# Patient Record
Sex: Female | Born: 1960 | Race: White | Hispanic: No | Marital: Married | Smoking: Never smoker
Health system: Southern US, Community
[De-identification: ages and names within clinical notes are randomized; demographics above are authoritative.]

---

## 1991-01-09 DIAGNOSIS — D229 Melanocytic nevi, unspecified: Secondary | ICD-10-CM

## 1991-01-09 HISTORY — DX: Melanocytic nevi, unspecified: D22.9

## 1997-08-08 DIAGNOSIS — D229 Melanocytic nevi, unspecified: Secondary | ICD-10-CM

## 1997-08-08 HISTORY — DX: Melanocytic nevi, unspecified: D22.9

## 1998-08-11 ENCOUNTER — Other Ambulatory Visit: Admission: RE | Admit: 1998-08-11 | Discharge: 1998-08-11 | Payer: Self-pay | Admitting: Obstetrics & Gynecology

## 1998-12-12 ENCOUNTER — Other Ambulatory Visit: Admission: RE | Admit: 1998-12-12 | Discharge: 1998-12-12 | Payer: Self-pay | Admitting: Obstetrics & Gynecology

## 1999-10-30 ENCOUNTER — Other Ambulatory Visit: Admission: RE | Admit: 1999-10-30 | Discharge: 1999-10-30 | Payer: Self-pay | Admitting: Obstetrics & Gynecology

## 2000-01-06 ENCOUNTER — Encounter (INDEPENDENT_AMBULATORY_CARE_PROVIDER_SITE_OTHER): Payer: Self-pay | Admitting: Specialist

## 2000-01-06 ENCOUNTER — Other Ambulatory Visit: Admission: RE | Admit: 2000-01-06 | Discharge: 2000-01-06 | Payer: Self-pay | Admitting: Plastic Surgery

## 2000-08-15 ENCOUNTER — Encounter: Payer: Self-pay | Admitting: Emergency Medicine

## 2000-08-15 ENCOUNTER — Emergency Department (HOSPITAL_COMMUNITY): Admission: EM | Admit: 2000-08-15 | Discharge: 2000-08-15 | Payer: Self-pay | Admitting: Emergency Medicine

## 2000-11-29 HISTORY — PX: REDUCTION MAMMAPLASTY: SUR839

## 2001-02-01 ENCOUNTER — Other Ambulatory Visit: Admission: RE | Admit: 2001-02-01 | Discharge: 2001-02-01 | Payer: Self-pay | Admitting: Obstetrics & Gynecology

## 2002-03-30 ENCOUNTER — Other Ambulatory Visit: Admission: RE | Admit: 2002-03-30 | Discharge: 2002-03-30 | Payer: Self-pay | Admitting: Obstetrics & Gynecology

## 2002-05-14 ENCOUNTER — Observation Stay (HOSPITAL_COMMUNITY): Admission: RE | Admit: 2002-05-14 | Discharge: 2002-05-15 | Payer: Self-pay | Admitting: Obstetrics & Gynecology

## 2002-05-14 ENCOUNTER — Encounter (INDEPENDENT_AMBULATORY_CARE_PROVIDER_SITE_OTHER): Payer: Self-pay | Admitting: Specialist

## 2003-09-02 ENCOUNTER — Encounter: Payer: Self-pay | Admitting: Obstetrics & Gynecology

## 2003-09-02 ENCOUNTER — Encounter: Admission: RE | Admit: 2003-09-02 | Discharge: 2003-09-02 | Payer: Self-pay | Admitting: Obstetrics & Gynecology

## 2004-09-02 ENCOUNTER — Encounter: Admission: RE | Admit: 2004-09-02 | Discharge: 2004-09-02 | Payer: Self-pay | Admitting: Obstetrics & Gynecology

## 2005-02-05 ENCOUNTER — Ambulatory Visit: Payer: Self-pay | Admitting: Internal Medicine

## 2005-05-23 ENCOUNTER — Emergency Department (HOSPITAL_COMMUNITY): Admission: EM | Admit: 2005-05-23 | Discharge: 2005-05-23 | Payer: Self-pay | Admitting: Family Medicine

## 2005-09-09 ENCOUNTER — Encounter: Admission: RE | Admit: 2005-09-09 | Discharge: 2005-09-09 | Payer: Self-pay | Admitting: Obstetrics & Gynecology

## 2005-12-29 ENCOUNTER — Ambulatory Visit: Payer: Self-pay | Admitting: Family Medicine

## 2006-09-12 ENCOUNTER — Encounter: Admission: RE | Admit: 2006-09-12 | Discharge: 2006-09-12 | Payer: Self-pay | Admitting: Obstetrics & Gynecology

## 2007-09-14 ENCOUNTER — Encounter: Admission: RE | Admit: 2007-09-14 | Discharge: 2007-09-14 | Payer: Self-pay | Admitting: Obstetrics & Gynecology

## 2008-03-04 DIAGNOSIS — C4491 Basal cell carcinoma of skin, unspecified: Secondary | ICD-10-CM

## 2008-03-04 HISTORY — DX: Basal cell carcinoma of skin, unspecified: C44.91

## 2008-09-16 ENCOUNTER — Encounter: Admission: RE | Admit: 2008-09-16 | Discharge: 2008-09-16 | Payer: Self-pay | Admitting: Obstetrics & Gynecology

## 2009-07-15 DIAGNOSIS — C4491 Basal cell carcinoma of skin, unspecified: Secondary | ICD-10-CM

## 2009-07-15 HISTORY — DX: Basal cell carcinoma of skin, unspecified: C44.91

## 2009-09-17 ENCOUNTER — Encounter: Admission: RE | Admit: 2009-09-17 | Discharge: 2009-09-17 | Payer: Self-pay | Admitting: Obstetrics & Gynecology

## 2010-01-21 ENCOUNTER — Emergency Department (HOSPITAL_COMMUNITY): Admission: EM | Admit: 2010-01-21 | Discharge: 2010-01-21 | Payer: Self-pay | Admitting: Family Medicine

## 2010-09-18 ENCOUNTER — Encounter: Admission: RE | Admit: 2010-09-18 | Discharge: 2010-09-18 | Payer: Self-pay | Admitting: Obstetrics & Gynecology

## 2010-09-24 ENCOUNTER — Encounter: Admission: RE | Admit: 2010-09-24 | Discharge: 2010-09-24 | Payer: Self-pay | Admitting: Obstetrics & Gynecology

## 2011-04-16 NOTE — Op Note (Signed)
[facility]  Patient:    Cindy Sanchez, Cindy Sanchez Visit Number: [id number] MRN: [id number]          Service Type: GYN Location: [id number] Attending Physician:  Lars Pinks Dictated by:   Caralyn Guile Arlyce Dice, M.D. Proc. Date: 05/14/02 Admit Date:  05/14/2002 Discharge Date: 05/15/2002                             Operative Report  PREOPERATIVE DIAGNOSES:       1. Leiomyomata uteri.                               2. Menorrhagia.  POSTOPERATIVE DIAGNOSES:      1. Leiomyomata uteri.                               2. Menorrhagia.  OPERATION:                    Transvaginal hysterectomy.  SURGEON:                      Richard D. Arlyce Dice, M.D.  ASSISTANT:                    Janeece Riggers. Dareen Piano, M.D.  ANESTHESIA:                   General endotracheal.  ESTIMATED BLOOD LOSS:         100 cc.  FINDINGS:                     Leiomyomatous uterus.  Normal appearing tubes and ovaries.  INDICATIONS:                  This is a 50 year old gravida 2, para 2, who has noted increasingly heavy periods with cramping over the last six months.  A sonohysterogram was performed and showed a 2 cm submucous myoma.  The option of hysteroscopic myomectomy versus transvaginal hysterectomy was discussed with the patient who opted to proceed with transvaginal hysterectomy.  DESCRIPTION OF PROCEDURE:     The patient was taken to the operating room, and general endotracheal anesthesia was induced.  She was then placed in the dorsal lithotomy position.  The vagina, perineum and lower abdomen were prepped and draped in a sterile fashion.  The paracervical tissues were infiltrated with 0.5% Marcaine with 1:200,000 epinephrine solution.  The incision was made around the circumference of the cervix, and the vaginal mucosa was dissected free.  The posterior cul-de-sac was entered, and the uterosacral ligaments were clamped, cut and ligated bilaterally.  Following this, the cardinal  ligaments and the uterine arteries were clamped with a LigaSure clamp, cauterized and cut.  The anterior cul-de-sac was entered and prior to the cautery of the uterine arteries.  At this point, the uterus was delivered posteriorly.  The uterine ovarian anastomoses were clamped with a ligature clamp, cauterized and cut.  This freed the specimen which was sent to pathology.  Hemostasis was noted to be present.  The cul-de-sac was closed with a chromic suture from uterosacral to uterosacral.  The vaginal cuff posteriorly was closed with a running interlocking 1 chromic gut suture.  The peritoneum was closed with a pursestring suture, and the anterior cuff was closed  with figure-of-eight sutures.  The procedure was then terminated.  The bladder was catheterized at the end of the procedure, and clear urine was obtained.  The patient left the operating room in good condition. Dictated by:   Caralyn Guile Arlyce Dice, M.D. Attending Physician:  Lars Pinks DD:  05/14/02 TD:  05/15/02 Job: [id number]

## 2011-04-16 NOTE — H&P (Signed)
[facility]  PatientKALLE, BERNATH Visit Number: [id number] MRN: [id number]          Service Type: Attending:  Caralyn Guile. Arlyce Dice, M.D. Dictated by:   Caralyn Guile Arlyce Dice, M.D. Adm. Date:  05/14/02                           History and Physical  CHIEF COMPLAINT:              Myoma uteri.  HISTORY OF PRESENT ILLNESS:   This is a 50 year old, gravida 2, para, who is admitted for a vaginal hysterectomy.  The patient has a six-month history of increasingly heavy periods with cramping.  The patient was noted on her last exam to have what appeared to be a large endocervical polyp which could not be removed with ring forceps.  She had a sonohistogram done which revealed a 2 cm submucous myoma.  In addition, several other small myomas were noted in the intramural area.  These findings were discussed with the patient, and various procedures were offered including a hysteroscopy with removal of the submucous myoma and a D&C.  The patient listened to all the options and decided that a vaginal hysterectomy was her choice because of the security of permanent cure from this operation.  The risks were discussed with the patient at that time.  PAST MEDICAL HISTORY:         Negative for any previous abdominal surgery. She had knee surgery in 2001 status post a fall.  She has no medical problems.  ALLERGIES:                    No known allergies.  FAMILY HISTORY:               Reveals no breast, ovarian, or colon cancer.  No significant cardiovascular risk.  SOCIAL HISTORY:               She has been married for 22 years.  She does not take recreational drugs.  She does not smoke cigarettes.  REVIEW OF SYSTEMS:            Negative in detail.  PHYSICAL EXAMINATION:  VITAL SIGNS:                  She is 5 feet 4 inches tall, 127 pounds. Afebrile.  Blood pressure 120/80.  HEENT:                        Ears, nose, and throat are normal.  NECK:                          Supple without thyromegaly or lymphadenopathy.  BREASTS:                      Without masses.  HEART:                        Regular sinus rhythm without murmurs or gallops.  LUNGS:                        Clear.  ABDOMEN:                      Soft without hepatosplenomegaly.  PELVIC:  External genitalia and vagina are normal.  Her cervix preoperatively appeared normal with no evidence of persistent endocervical polyp.  The uterus was anterior and palpably normal size.  IMPRESSION:                   1. Menorrhagia                               2. Submucous myoma as demonstrated on                                  ultrasound.  PLAN:                         Transvaginal hysterectomy. Dictated by:   Caralyn Guile Arlyce Dice, M.D. Attending:  Caralyn Guile. Arlyce Dice, M.D. DD:  05/12/02 TD:  05/12/02 Job: [id number]

## 2011-07-14 ENCOUNTER — Other Ambulatory Visit: Payer: Self-pay | Admitting: Dermatology

## 2011-09-27 ENCOUNTER — Other Ambulatory Visit: Payer: Self-pay | Admitting: Obstetrics & Gynecology

## 2011-09-27 ENCOUNTER — Ambulatory Visit
Admission: RE | Admit: 2011-09-27 | Discharge: 2011-09-27 | Disposition: A | Discharge status: Home / Self Care | Payer: BC Managed Care – PPO | Source: Ambulatory Visit | Attending: Obstetrics & Gynecology | Admitting: Obstetrics & Gynecology

## 2011-09-27 DIAGNOSIS — Z1231 Encounter for screening mammogram for malignant neoplasm of breast: Secondary | ICD-10-CM

## 2012-07-13 ENCOUNTER — Other Ambulatory Visit: Payer: Self-pay | Admitting: Dermatology

## 2012-08-21 ENCOUNTER — Other Ambulatory Visit: Payer: Self-pay | Admitting: Obstetrics & Gynecology

## 2012-08-21 DIAGNOSIS — Z1231 Encounter for screening mammogram for malignant neoplasm of breast: Secondary | ICD-10-CM

## 2012-10-02 ENCOUNTER — Ambulatory Visit
Admission: RE | Admit: 2012-10-02 | Discharge: 2012-10-02 | Disposition: A | Discharge status: Home / Self Care | Payer: BC Managed Care – PPO | Source: Ambulatory Visit | Attending: Obstetrics & Gynecology | Admitting: Obstetrics & Gynecology

## 2012-10-02 DIAGNOSIS — Z1231 Encounter for screening mammogram for malignant neoplasm of breast: Secondary | ICD-10-CM

## 2013-03-21 ENCOUNTER — Other Ambulatory Visit: Payer: Self-pay | Admitting: Dermatology

## 2013-10-02 ENCOUNTER — Other Ambulatory Visit: Payer: Self-pay

## 2013-10-02 DIAGNOSIS — Z1231 Encounter for screening mammogram for malignant neoplasm of breast: Secondary | ICD-10-CM

## 2013-10-02 DIAGNOSIS — Z9889 Other specified postprocedural states: Secondary | ICD-10-CM

## 2013-10-08 ENCOUNTER — Ambulatory Visit
Admission: RE | Admit: 2013-10-08 | Discharge: 2013-10-08 | Disposition: A | Discharge status: Home / Self Care | Payer: BC Managed Care – PPO | Source: Ambulatory Visit

## 2013-10-08 DIAGNOSIS — Z9889 Other specified postprocedural states: Secondary | ICD-10-CM

## 2013-10-08 DIAGNOSIS — Z1231 Encounter for screening mammogram for malignant neoplasm of breast: Secondary | ICD-10-CM

## 2013-10-09 ENCOUNTER — Ambulatory Visit: Payer: BC Managed Care – PPO

## 2013-11-05 ENCOUNTER — Ambulatory Visit: Payer: BC Managed Care – PPO

## 2014-08-12 ENCOUNTER — Other Ambulatory Visit: Payer: Self-pay | Admitting: Dermatology

## 2014-09-04 ENCOUNTER — Other Ambulatory Visit: Payer: Self-pay

## 2014-09-04 DIAGNOSIS — Z9889 Other specified postprocedural states: Secondary | ICD-10-CM

## 2014-09-04 DIAGNOSIS — Z1231 Encounter for screening mammogram for malignant neoplasm of breast: Secondary | ICD-10-CM

## 2014-10-14 ENCOUNTER — Ambulatory Visit
Admission: RE | Admit: 2014-10-14 | Discharge: 2014-10-14 | Disposition: A | Discharge status: Home / Self Care | Payer: BC Managed Care – PPO | Source: Ambulatory Visit

## 2014-10-14 DIAGNOSIS — Z9889 Other specified postprocedural states: Secondary | ICD-10-CM

## 2014-10-14 DIAGNOSIS — Z1231 Encounter for screening mammogram for malignant neoplasm of breast: Secondary | ICD-10-CM

## 2015-10-14 ENCOUNTER — Other Ambulatory Visit: Payer: Self-pay

## 2015-10-14 DIAGNOSIS — Z1231 Encounter for screening mammogram for malignant neoplasm of breast: Secondary | ICD-10-CM

## 2015-12-08 ENCOUNTER — Ambulatory Visit
Admission: RE | Admit: 2015-12-08 | Discharge: 2015-12-08 | Disposition: A | Discharge status: Home / Self Care | Payer: No Typology Code available for payment source | Source: Ambulatory Visit

## 2015-12-08 ENCOUNTER — Ambulatory Visit: Payer: Self-pay

## 2015-12-08 DIAGNOSIS — Z1231 Encounter for screening mammogram for malignant neoplasm of breast: Secondary | ICD-10-CM

## 2017-01-10 ENCOUNTER — Other Ambulatory Visit: Payer: Self-pay | Admitting: Obstetrics & Gynecology

## 2017-01-10 DIAGNOSIS — Z1231 Encounter for screening mammogram for malignant neoplasm of breast: Secondary | ICD-10-CM

## 2017-01-31 ENCOUNTER — Ambulatory Visit
Admission: RE | Admit: 2017-01-31 | Discharge: 2017-01-31 | Disposition: A | Discharge status: Home / Self Care | Payer: No Typology Code available for payment source | Source: Ambulatory Visit | Attending: Obstetrics & Gynecology | Admitting: Obstetrics & Gynecology

## 2017-01-31 DIAGNOSIS — Z1231 Encounter for screening mammogram for malignant neoplasm of breast: Secondary | ICD-10-CM

## 2018-01-30 ENCOUNTER — Other Ambulatory Visit: Payer: Self-pay | Admitting: Family Medicine

## 2018-01-30 DIAGNOSIS — Z1231 Encounter for screening mammogram for malignant neoplasm of breast: Secondary | ICD-10-CM

## 2018-02-06 ENCOUNTER — Encounter (INDEPENDENT_AMBULATORY_CARE_PROVIDER_SITE_OTHER): Payer: Self-pay

## 2018-02-06 ENCOUNTER — Ambulatory Visit
Admission: RE | Admit: 2018-02-06 | Discharge: 2018-02-06 | Disposition: A | Discharge status: Home / Self Care | Payer: No Typology Code available for payment source | Source: Ambulatory Visit | Attending: Family Medicine | Admitting: Family Medicine

## 2018-02-06 ENCOUNTER — Other Ambulatory Visit: Payer: Self-pay | Admitting: Obstetrics & Gynecology

## 2018-02-06 DIAGNOSIS — Z1231 Encounter for screening mammogram for malignant neoplasm of breast: Secondary | ICD-10-CM

## 2018-02-13 ENCOUNTER — Ambulatory Visit: Payer: No Typology Code available for payment source

## 2019-04-24 ENCOUNTER — Other Ambulatory Visit: Payer: Self-pay | Admitting: Internal Medicine

## 2019-04-24 ENCOUNTER — Other Ambulatory Visit: Payer: Self-pay | Admitting: Obstetrics & Gynecology

## 2019-04-24 DIAGNOSIS — Z1231 Encounter for screening mammogram for malignant neoplasm of breast: Secondary | ICD-10-CM

## 2019-06-05 ENCOUNTER — Ambulatory Visit
Admission: RE | Admit: 2019-06-05 | Discharge: 2019-06-05 | Disposition: A | Discharge status: Home / Self Care | Payer: No Typology Code available for payment source | Source: Ambulatory Visit | Attending: Internal Medicine | Admitting: Internal Medicine

## 2019-06-05 ENCOUNTER — Other Ambulatory Visit: Payer: Self-pay

## 2019-06-05 ENCOUNTER — Ambulatory Visit: Payer: Self-pay

## 2019-06-05 DIAGNOSIS — Z1231 Encounter for screening mammogram for malignant neoplasm of breast: Secondary | ICD-10-CM

## 2019-06-11 ENCOUNTER — Ambulatory Visit: Payer: Self-pay

## 2020-03-17 ENCOUNTER — Encounter: Payer: Self-pay | Admitting: Dermatology

## 2020-03-17 ENCOUNTER — Other Ambulatory Visit: Payer: Self-pay

## 2020-03-17 ENCOUNTER — Ambulatory Visit (INDEPENDENT_AMBULATORY_CARE_PROVIDER_SITE_OTHER): Payer: Self-pay | Admitting: Dermatology

## 2020-03-17 DIAGNOSIS — D492 Neoplasm of unspecified behavior of bone, soft tissue, and skin: Secondary | ICD-10-CM

## 2020-03-17 DIAGNOSIS — D485 Neoplasm of uncertain behavior of skin: Secondary | ICD-10-CM

## 2020-03-17 DIAGNOSIS — Z1283 Encounter for screening for malignant neoplasm of skin: Secondary | ICD-10-CM

## 2020-03-17 NOTE — Patient Instructions (Addendum)
General skin examination done.  There are no atypical moles.  Multiple keratoses with the 2 largest ones being on the left inner breast.  Cindy Sanchez can decide in the future whether she wants these removed.  4 mm firm bump on the right calf represents a dermatofibroma and does not require removal.  Red bumps on the right lower outer abdomen and the back of the left shoulder are benign angiomas and can be left.  On the right inner breast is a shiny 4 mm papule; dermoscopy showed to corkscrew vessels so biopsy was obtained to rule out basal cell carcinoma and the base cauterized.  She understands there is a risk of keloid in this location.  She will check on my chart in 2days or call my office to discuss the biopsy result.Biopsy, Surgery (Curettage) & Surgery (Excision) Aftercare Instructions  1. Okay to remove bandage in 24 hours  2. Wash area with soap and water  3. Apply Vaseline to area twice daily until healed (Not Neosporin)  4. Okay to cover with a Band-Aid to decrease the chance of infection or prevent irritation from clothing; also it's okay to uncover lesion at home.  5. Suture instructions: return to our office in 7-10 or 10-14 days for a nurse visit for suture removal. Variable healing with sutures, if pain or itching occurs call our office. It's okay to shower or bathe 24 hours after sutures are given.  6. The following risks may occur after a biopsy, curettage or excision: bleeding, scarring, discoloration, recurrence, infection (redness, yellow drainage, pain or swelling).  7. For questions, concerns and results call our office at [place] before 4pm & Friday before 3pm. Biopsy results will be available in 1 week.

## 2020-03-17 NOTE — Progress Notes (Addendum)
   Follow-Up Visit   Subjective  Cindy Sanchez is a 59 y.o. female who presents for the following: Annual Exam (no concerns left palm new).  growth Location:  Duration:  Quality:  Associated Signs/Symptoms: Modifying Factors:  Severity:  Timing: Context:   The following portions of the chart were reviewed this encounter and updated as appropriate: Tobacco  Allergies  Meds  Problems  Med Hx  Surg Hx  Fam Hx      Objective  Well appearing patient in no apparent distress; mood and affect are within normal limits.  A full examination was performed including scalp, head, eyes, ears, nose, lips, neck, chest, axillae, abdomen, back, buttocks, bilateral upper extremities, bilateral lower extremities, hands, feet, fingers, toes, fingernails, and toenails. All findings within normal limits unless otherwise noted below.   Assessment & Plan  Neoplasm of skin Right Breast  Skin / nail biopsy Type of biopsy: tangential   Informed consent: discussed and consent obtained   Anesthesia: the lesion was anesthetized in a standard fashion   Anesthetic:  1% lidocaine w/ epinephrine 1-100,000 local infiltration Instrument used: flexible razor blade   Hemostasis achieved with: ferric subsulfate   Outcome: patient tolerated procedure well   Post-procedure details: sterile dressing applied and wound care instructions given   Dressing type: petrolatum   Additional details:  Patient identified lesion of concern.  Lesion identified by physician.  Specimen 1 - Surgical pathology Differential Diagnosis: r/o bcc scc Check Margins: No

## 2020-03-19 ENCOUNTER — Encounter: Payer: Self-pay | Admitting: Dermatology

## 2020-03-19 ENCOUNTER — Telehealth: Payer: Self-pay | Admitting: Dermatology

## 2020-03-19 NOTE — Telephone Encounter (Signed)
Patient was told by Dr. Denna Haggard to call office when patient received results in my chart. Patient is calling for clarification of those results. [id number]

## 2020-03-19 NOTE — Telephone Encounter (Signed)
Pathology results to patient and scheduled 30 minute surgery with Dr. Denna Haggard for [facility] on right breast

## 2020-04-10 ENCOUNTER — Encounter: Payer: Self-pay | Admitting: Dermatology

## 2020-04-15 ENCOUNTER — Encounter: Payer: Self-pay | Admitting: *Deleted

## 2020-04-17 ENCOUNTER — Ambulatory Visit (INDEPENDENT_AMBULATORY_CARE_PROVIDER_SITE_OTHER): Payer: Self-pay | Admitting: Dermatology

## 2020-04-17 ENCOUNTER — Other Ambulatory Visit: Payer: Self-pay

## 2020-04-17 ENCOUNTER — Encounter: Payer: Self-pay | Admitting: Dermatology

## 2020-04-17 DIAGNOSIS — C44511 Basal cell carcinoma of skin of breast: Secondary | ICD-10-CM

## 2020-04-17 DIAGNOSIS — C4491 Basal cell carcinoma of skin, unspecified: Secondary | ICD-10-CM

## 2020-04-17 NOTE — Patient Instructions (Signed)

## 2020-04-26 ENCOUNTER — Encounter: Payer: Self-pay | Admitting: Dermatology

## 2020-05-26 NOTE — Progress Notes (Signed)
   Follow-Up Visit   Subjective  Cindy Sanchez is a 59 y.o. female who presents for the following: Procedure ([place] with mixed pattern on right breast.).  BCC Location: Chest Duration:  Quality:  Associated Signs/Symptoms: Modifying Factors:  Severity:  Timing: Context: For treatment  The following portions of the chart were reviewed this encounter and updated as appropriate: Tobacco  Allergies  Meds  Problems  Med Hx  Surg Hx  Fam Hx      Objective  Well appearing patient in no apparent distress; mood and affect are within normal limits.  A focused examination was performed including Chest, back, arms, face.. Relevant physical exam findings are noted in the Assessment and Plan.   Assessment & Plan  Basal cell carcinoma (BCC), unspecified site Right Breast  Destruction of lesion Complexity: simple   Destruction method: electrodesiccation and curettage   Informed consent: discussed and consent obtained   Timeout:  patient name, date of birth, surgical site, and procedure verified Anesthesia: the lesion was anesthetized in a standard fashion   Anesthetic:  1% lidocaine w/ epinephrine 1-100,000 local infiltration Curettage performed in three different directions: Yes   Curettage cycles:  3 Lesion length (cm):  1 Lesion width (cm):  1 Margin per side (cm):  0 Final wound size (cm):  1 Hemostasis achieved with:  ferric subsulfate Outcome: patient tolerated procedure well with no complications   Post-procedure details: wound care instructions given   Additional details:  Inoculated with parenteral 5% fluorouracil

## 2020-06-23 ENCOUNTER — Other Ambulatory Visit: Payer: Self-pay | Admitting: Internal Medicine

## 2020-06-23 DIAGNOSIS — Z1231 Encounter for screening mammogram for malignant neoplasm of breast: Secondary | ICD-10-CM

## 2020-06-24 ENCOUNTER — Other Ambulatory Visit: Payer: Self-pay

## 2020-06-24 ENCOUNTER — Ambulatory Visit
Admission: RE | Admit: 2020-06-24 | Discharge: 2020-06-24 | Disposition: A | Discharge status: Home / Self Care | Payer: No Typology Code available for payment source | Source: Ambulatory Visit | Attending: Internal Medicine | Admitting: Internal Medicine

## 2020-06-24 DIAGNOSIS — Z1231 Encounter for screening mammogram for malignant neoplasm of breast: Secondary | ICD-10-CM

## 2021-04-23 ENCOUNTER — Encounter: Payer: Self-pay | Admitting: Dermatology

## 2021-04-23 ENCOUNTER — Other Ambulatory Visit: Payer: Self-pay

## 2021-04-23 ENCOUNTER — Ambulatory Visit (INDEPENDENT_AMBULATORY_CARE_PROVIDER_SITE_OTHER): Payer: Self-pay | Admitting: Dermatology

## 2021-04-23 DIAGNOSIS — D485 Neoplasm of uncertain behavior of skin: Secondary | ICD-10-CM

## 2021-04-23 DIAGNOSIS — C44619 Basal cell carcinoma of skin of left upper limb, including shoulder: Secondary | ICD-10-CM

## 2021-04-23 NOTE — Patient Instructions (Signed)

## 2021-04-23 NOTE — Progress Notes (Signed)
   Follow-Up Visit   Subjective  Cindy Sanchez is a 60 y.o. female who presents for the following: Procedure (Left shoulder keloid?).  bcc Location:  Duration:  Quality:  Associated Signs/Symptoms: Modifying Factors:  Severity:  Timing: Context:   Objective  Well appearing patient in no apparent distress; mood and affect are within normal limits.   A focused examination was performed including arm. Relevant physical exam findings are noted in the Assessment and Plan.   Assessment & Plan    Neoplasm of uncertain behavior of skin Left Upper Arm  Skin / nail biopsy Type of biopsy: tangential   Informed consent: discussed and consent obtained   Timeout: patient name, date of birth, surgical site, and procedure verified   Procedure prep:  Patient was prepped and draped in usual sterile fashion (Non sterile) Prep type:  Chlorhexidine Anesthesia: the lesion was anesthetized in a standard fashion   Anesthetic:  1% lidocaine w/ epinephrine 1-100,000 local infiltration Instrument used: flexible razor blade   Hemostasis achieved with: ferric subsulfate   Outcome: patient tolerated procedure well   Post-procedure details: sterile dressing applied and wound care instructions given   Dressing type: bandage and petrolatum    Destruction of lesion Complexity: simple   Destruction method: electrodesiccation and curettage   Informed consent: discussed and consent obtained   Timeout:  patient name, date of birth, surgical site, and procedure verified Anesthesia: the lesion was anesthetized in a standard fashion   Anesthetic:  1% lidocaine w/ epinephrine 1-100,000 local infiltration Curettage performed in three different directions: Yes   Electrodesiccation performed over the curetted area: Yes   Curettage cycles:  3 Margin per side (cm):  0.1 Final wound size (cm):  1.3 Hemostasis achieved with:  ferric subsulfate Outcome: patient tolerated procedure well with no complications    Post-procedure details: sterile dressing applied and wound care instructions given   Dressing type: bandage and petrolatum   Additional details:  Wound innoculated with 5 fluorouracil solution.  Specimen 1 - Surgical pathology Differential Diagnosis: R/O BCC - treated after biopsy [id number] Check Margins: No      I, Lavonna Monarch, MD, have reviewed all documentation for this visit.  The documentation on 04/23/21 for the exam, diagnosis, procedures, and orders are all accurate and complete.

## 2021-05-04 ENCOUNTER — Telehealth: Payer: Self-pay | Admitting: Dermatology

## 2021-05-04 NOTE — Telephone Encounter (Signed)
Patient called for biopsy results. Confirm with clinical staff that ST's message could be given to patient. I informed her that [facility] was treated at time of biopsy and 3-6 month follow up was needed. She already had 6 month follow up scheduled. Patient voiced understanding.

## 2021-05-07 ENCOUNTER — Encounter: Payer: Self-pay | Admitting: Dermatology

## 2021-05-07 NOTE — Progress Notes (Signed)
   Follow-Up Visit   Subjective  Cindy Sanchez is a 60 y.o. female who presents for the following: Procedure (Left shoulder keloid?).  Growth left upper arm which patient feels might be a keloid Location:  Duration:  Quality:  Associated Signs/Symptoms: Modifying Factors:  Severity:  Timing: Context:   Objective  Well appearing patient in no apparent distress; mood and affect are within normal limits. Left Upper Arm Although Cindy Sanchez thought that the nodule on her left arm was a keloid scar from previous procedure, this 1.5cm pearly nodule is almost certainly residual basal cell carcinoma.  I told her that the highest cure rate would be Mohs surgery, but because of her insurance status she strongly wanted to avoid this.  I then mention that I could schedule to do an excision which would give [place] a marginal microscopic and potentially less scar than curettage cautery, but she requested I proceed with nonexcisional treatment.    A focused examination was performed including left arm. Relevant physical exam findings are noted in the Assessment and Plan.   Assessment & Plan    Basal cell carcinoma of skin of left upper limb, including shoulder Left Upper Arm  Skin / nail biopsy Type of biopsy: tangential   Informed consent: discussed and consent obtained   Timeout: patient name, date of birth, surgical site, and procedure verified   Procedure prep:  Patient was prepped and draped in usual sterile fashion (Non sterile) Prep type:  Chlorhexidine Anesthesia: the lesion was anesthetized in a standard fashion   Anesthetic:  1% lidocaine w/ epinephrine 1-100,000 local infiltration Instrument used: flexible razor blade   Hemostasis achieved with: ferric subsulfate   Outcome: patient tolerated procedure well   Post-procedure details: sterile dressing applied and wound care instructions given   Dressing type: bandage and petrolatum    Destruction of lesion Complexity: simple    Destruction method: electrodesiccation and curettage   Informed consent: discussed and consent obtained   Timeout:  patient name, date of birth, surgical site, and procedure verified Anesthesia: the lesion was anesthetized in a standard fashion   Anesthetic:  1% lidocaine w/ epinephrine 1-100,000 local infiltration Curettage performed in three different directions: Yes   Electrodesiccation performed over the curetted area: Yes   Curettage cycles:  3 Lesion length (cm):  1.3 Lesion width (cm):  1.3 Margin per side (cm):  0 Final wound size (cm):  1.3 Hemostasis achieved with:  ferric subsulfate Outcome: patient tolerated procedure well with no complications   Post-procedure details: sterile dressing applied and wound care instructions given   Dressing type: bandage and petrolatum   Additional details:  Wound innoculated with 5 fluorouracil solution.  Specimen 1 - Surgical pathology Differential Diagnosis: R/O BCC - treated after biopsy [id number] Check Margins: No  After shave biopsy, the base of the lesion was triple curettage showing fairly deep extension.  Cautery was done and the base inoculated with parenteral fluorouracil.  Should this recur, I hope she will agree to Mohs surgery.      I, Lavonna Monarch, MD, have reviewed all documentation for this visit.  The documentation on 05/07/21 for the exam, diagnosis, procedures, and orders are all accurate and complete.

## 2021-07-06 ENCOUNTER — Other Ambulatory Visit: Payer: Self-pay | Admitting: Internal Medicine

## 2021-07-06 DIAGNOSIS — Z1231 Encounter for screening mammogram for malignant neoplasm of breast: Secondary | ICD-10-CM

## 2021-07-07 ENCOUNTER — Other Ambulatory Visit: Payer: Self-pay

## 2021-07-07 ENCOUNTER — Ambulatory Visit
Admission: RE | Admit: 2021-07-07 | Discharge: 2021-07-07 | Disposition: A | Discharge status: Home / Self Care | Payer: No Typology Code available for payment source | Source: Ambulatory Visit | Attending: Internal Medicine | Admitting: Internal Medicine

## 2021-07-07 DIAGNOSIS — Z1231 Encounter for screening mammogram for malignant neoplasm of breast: Secondary | ICD-10-CM

## 2021-07-07 IMAGING — MG MM DIGITAL SCREENING BILAT W/ TOMO AND CAD
8 series · 8 of 24 positions shown · non-contrast
Comparison: Previous exam(s).

CLINICAL DATA: Screening.

EXAM:
DIGITAL SCREENING BILATERAL MAMMOGRAM WITH TOMOSYNTHESIS AND CAD
TECHNIQUE: Bilateral screening digital craniocaudal and mediolateral oblique
mammograms were obtained. Bilateral screening digital breast
tomosynthesis was performed. The images were evaluated with
computer-aided detection.

[R CC synth-2D]
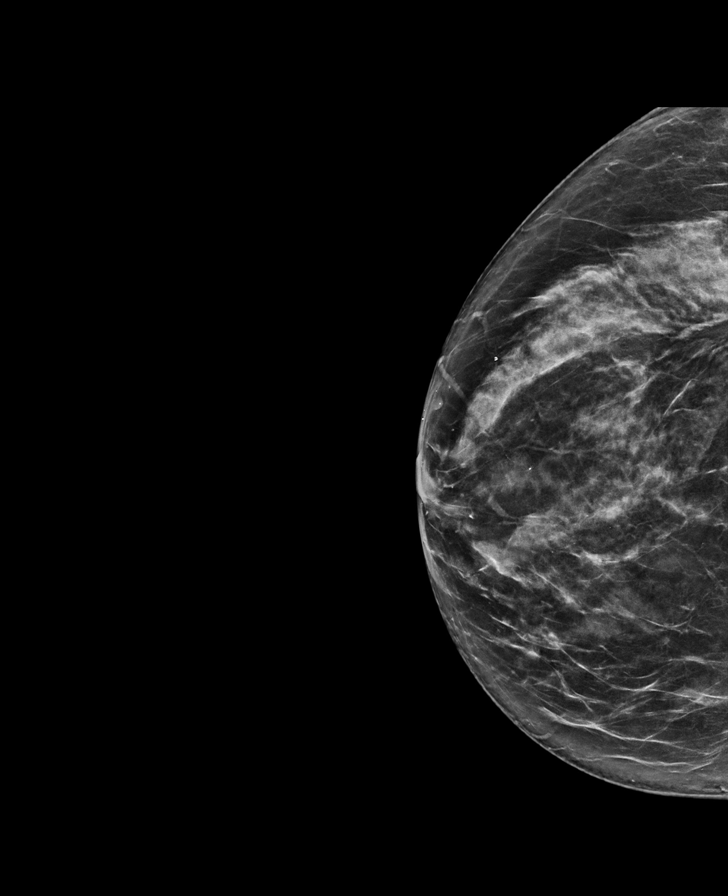

[L MLO synth-2D]
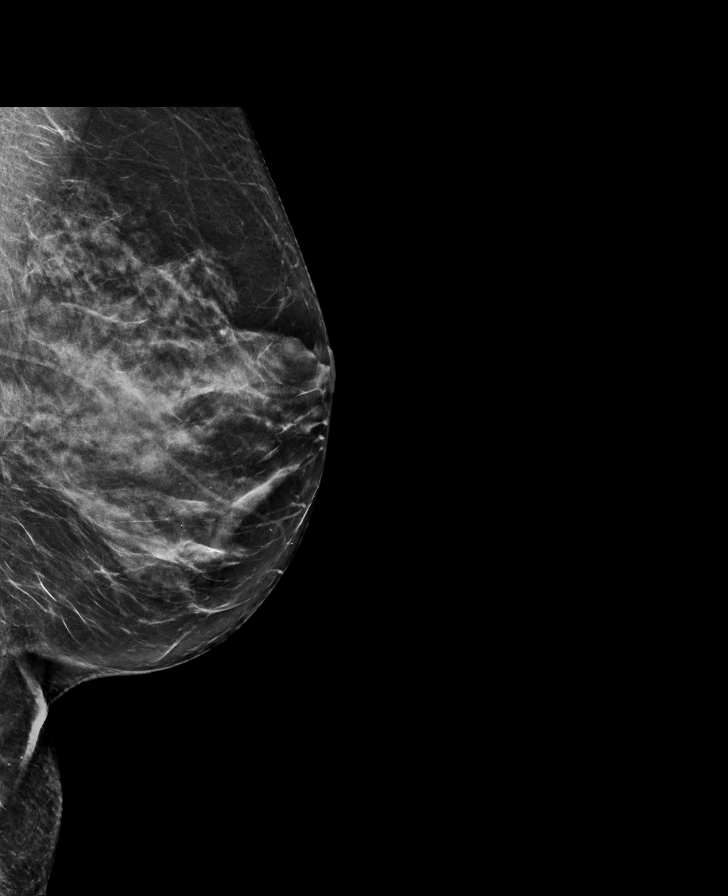

[R MLO synth-2D]
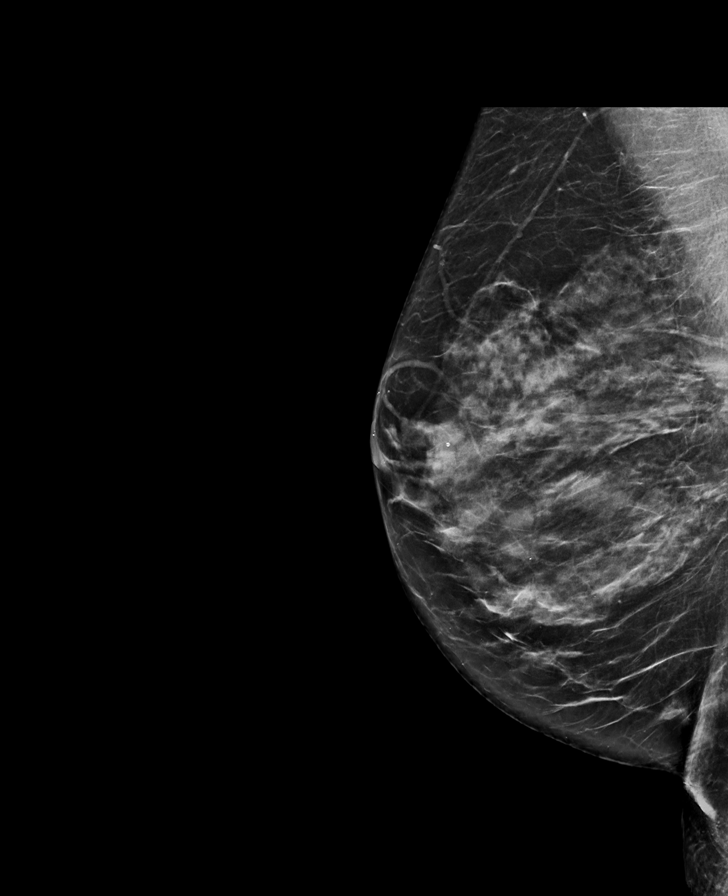

[L CC synth-2D]
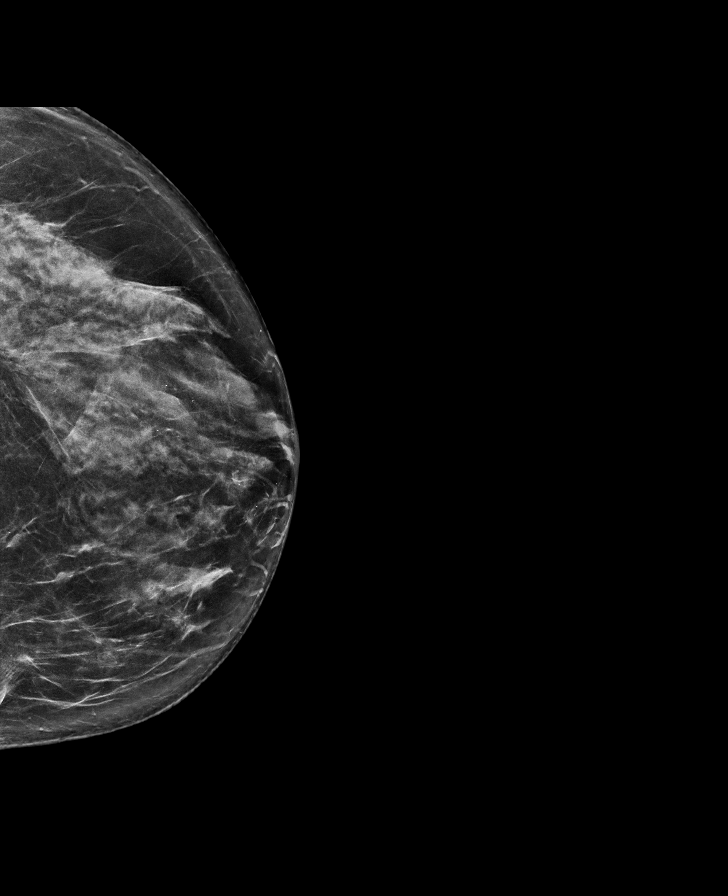

[L CC tomo · tomo slice 39/76.0]
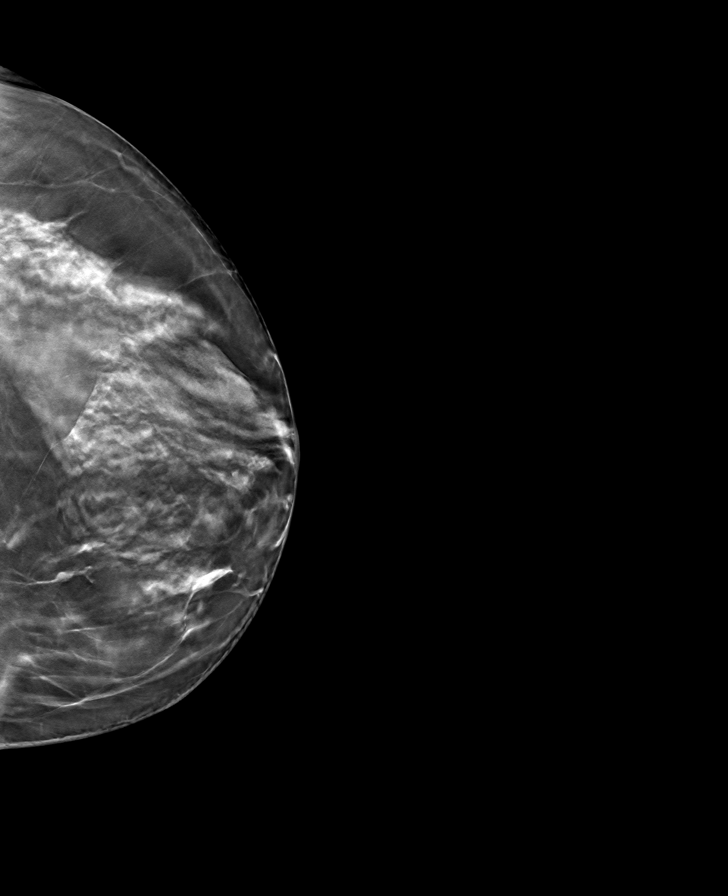

[R MLO tomo · tomo slice 38/75.0]
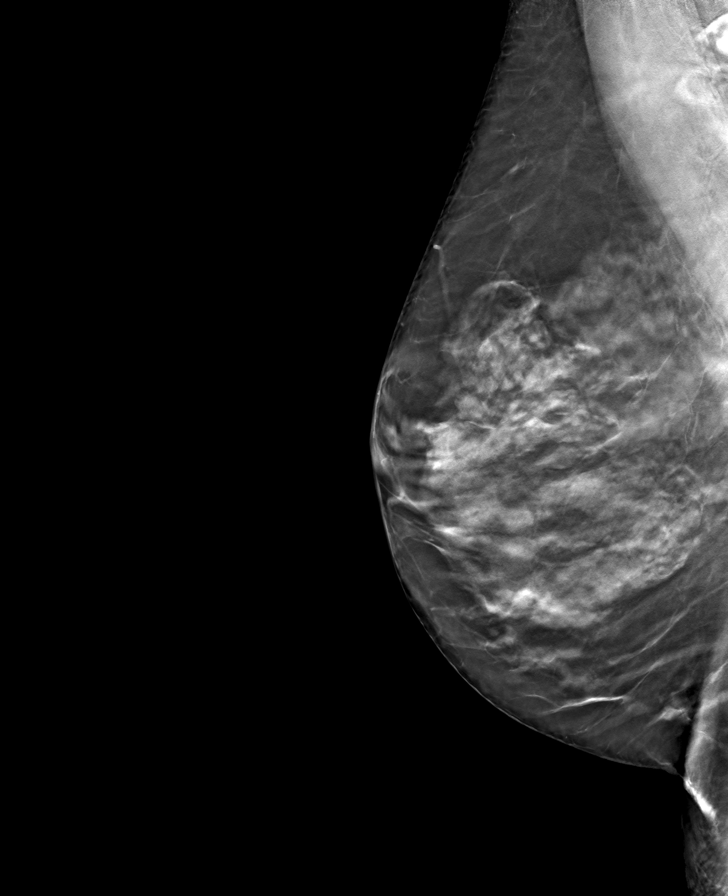

[R CC tomo · tomo slice 37/72.0]
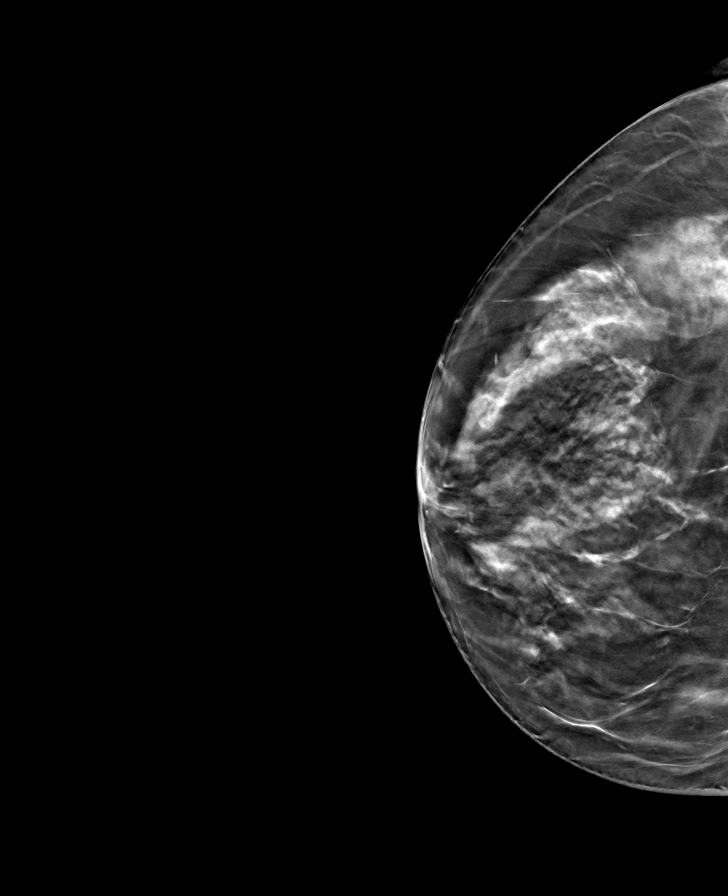

[L MLO tomo · tomo slice 37/74.0]
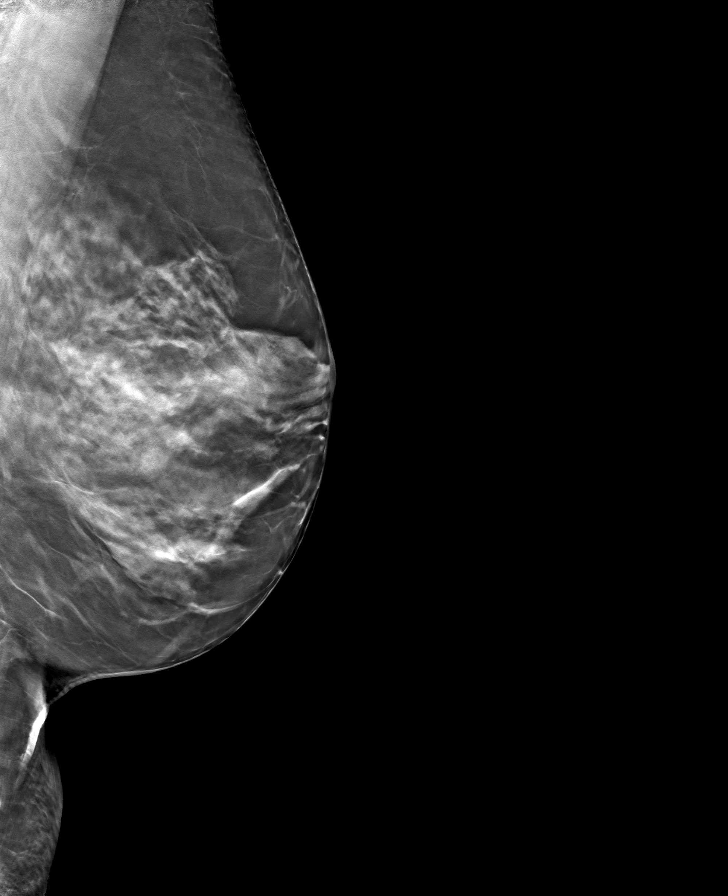

[8 of 24 positions shown; findings below may reference images not displayed]

ACR Breast Density Category c: The breast tissue is heterogeneously
dense, which may obscure small masses.
FINDINGS: There are no findings suspicious for malignancy.
IMPRESSION: No mammographic evidence of malignancy. A result letter of this
screening mammogram will be mailed directly to the patient.

RECOMMENDATION:
Screening mammogram in one year. (Code:[V2])

BI-RADS CATEGORY  1: Negative.

## 2021-10-07 ENCOUNTER — Encounter: Payer: Self-pay | Admitting: Dermatology

## 2021-10-07 ENCOUNTER — Other Ambulatory Visit: Payer: Self-pay

## 2021-10-07 ENCOUNTER — Ambulatory Visit (INDEPENDENT_AMBULATORY_CARE_PROVIDER_SITE_OTHER): Payer: Self-pay | Admitting: Dermatology

## 2021-10-07 DIAGNOSIS — L910 Hypertrophic scar: Secondary | ICD-10-CM

## 2021-10-07 DIAGNOSIS — Z85828 Personal history of other malignant neoplasm of skin: Secondary | ICD-10-CM

## 2021-10-07 MED ORDER — TRIAMCINOLONE ACETONIDE 40 MG/ML IJ SUSP
40.0000 mg | Freq: Once | INTRAMUSCULAR | Status: AC
  Administered 2021-10-07: 40 mg

## 2021-10-18 NOTE — Addendum Note (Signed)
Addended by: Lavonna Monarch on: 10/18/2021 12:02 PM   Modules accepted: Level of Service

## 2021-10-18 NOTE — Progress Notes (Signed)
   Follow-Up Visit   Subjective  Cindy Sanchez is a 60 y.o. female who presents for the following: Follow-up (F/u -Left upper arm- healing okay - no concerns).  Check surgical site left arm Location:  Duration:  Quality:  Associated Signs/Symptoms: Modifying Factors:  Severity:  Timing: Context:   Objective  Well appearing patient in no apparent distress; mood and affect are within normal limits. Left Upper Arm - Anterior Pink 6 millimeter thickening compatible with small keloid.  Left Upper Arm - Anterior Smooth pink thickening side of skin cancer removal, more likely scar than residual skin cancer    A focused examination was performed including arm. Relevant physical exam findings are noted in the Assessment and Plan.   Assessment & Plan    Keloid Left Upper Arm - Anterior  triamcinolone acetonide (KENALOG-40) injection 40 mg - Left Upper Arm - Anterior   Intralesional injection - Left Upper Arm - Anterior Location: Left Upper Arm  Informed Consent: Discussed risks (infection, pain, bleeding, bruising, thinning of the skin, loss of skin pigment, lack of resolution, and recurrence of lesion) and benefits of the procedure, as well as the alternatives. Informed consent was obtained. Preparation: The area was prepared a standard fashion.  Procedure Details: An intralesional injection was performed with Kenalog 40 mg/cc. 1 cc in total were injected.  Total number of injections: 1  Plan: The patient was instructed on post-op care. Recommend OTC analgesia as needed for pain.   Personal history of skin cancer Left Upper Arm - Anterior  Will inject with triamcinolone but if there is increased or growth or bleeding will consider referral for Mohs surgery.      I, Lavonna Monarch, MD, have reviewed all documentation for this visit.  The documentation on 10/18/21 for the exam, diagnosis, procedures, and orders are all accurate and complete.

## 2021-10-26 ENCOUNTER — Ambulatory Visit: Payer: No Typology Code available for payment source | Admitting: Dermatology

## 2022-08-03 ENCOUNTER — Other Ambulatory Visit: Payer: Self-pay | Admitting: Internal Medicine

## 2022-08-03 ENCOUNTER — Ambulatory Visit
Admission: RE | Admit: 2022-08-03 | Discharge: 2022-08-03 | Disposition: A | Discharge status: Home / Self Care | Payer: Self-pay | Source: Ambulatory Visit | Attending: Internal Medicine | Admitting: Internal Medicine

## 2022-08-03 DIAGNOSIS — Z1231 Encounter for screening mammogram for malignant neoplasm of breast: Secondary | ICD-10-CM

## 2023-08-15 ENCOUNTER — Other Ambulatory Visit: Payer: Self-pay | Admitting: Internal Medicine

## 2023-08-15 ENCOUNTER — Ambulatory Visit
Admission: RE | Admit: 2023-08-15 | Discharge: 2023-08-15 | Disposition: A | Discharge status: Home / Self Care | Payer: No Typology Code available for payment source | Source: Ambulatory Visit | Attending: Internal Medicine | Admitting: Internal Medicine

## 2023-08-15 DIAGNOSIS — Z1231 Encounter for screening mammogram for malignant neoplasm of breast: Secondary | ICD-10-CM

## 2024-08-20 ENCOUNTER — Ambulatory Visit
Admission: RE | Admit: 2024-08-20 | Discharge: 2024-08-20 | Disposition: A | Discharge status: Home / Self Care | Source: Ambulatory Visit | Attending: Internal Medicine | Admitting: Internal Medicine

## 2024-08-20 ENCOUNTER — Other Ambulatory Visit: Payer: Self-pay | Admitting: Internal Medicine

## 2024-08-20 DIAGNOSIS — Z1231 Encounter for screening mammogram for malignant neoplasm of breast: Secondary | ICD-10-CM

## 2024-08-29 ENCOUNTER — Ambulatory Visit: Payer: Self-pay
